# Patient Record
Sex: Male | Born: 1970 | Race: White | Hispanic: No | Marital: Single | Smoking: Never smoker
Health system: Southern US, Community
[De-identification: ages and names within clinical notes are randomized; demographics above are authoritative.]

---

## 2021-07-30 ENCOUNTER — Other Ambulatory Visit: Payer: Self-pay

## 2021-07-30 ENCOUNTER — Emergency Department (INDEPENDENT_AMBULATORY_CARE_PROVIDER_SITE_OTHER)
Admission: EM | Admit: 2021-07-30 | Discharge: 2021-07-30 | Disposition: A | Discharge status: Home / Self Care | Payer: Medicare Other | Source: Home / Self Care

## 2021-07-30 DIAGNOSIS — M79662 Pain in left lower leg: Secondary | ICD-10-CM

## 2021-07-30 DIAGNOSIS — M7989 Other specified soft tissue disorders: Secondary | ICD-10-CM

## 2021-07-30 NOTE — Discharge Instructions (Addendum)
Advised patient to return to clinic tomorrow Friday, 07/31/2021 at 10 AM for ultrasound study of left lower leg to rule out DVT.  Advised patient to go straight to imaging entrance downstairs behind building.
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
## 2021-07-31 ENCOUNTER — Other Ambulatory Visit: Payer: Medicare Other

## 2021-07-31 ENCOUNTER — Emergency Department
Admission: EM | Admit: 2021-07-31 | Discharge: 2021-07-31 | Disposition: A | Discharge status: Home / Self Care | Payer: Medicare Other | Source: Home / Self Care

## 2021-07-31 ENCOUNTER — Emergency Department (INDEPENDENT_AMBULATORY_CARE_PROVIDER_SITE_OTHER): Payer: Medicare Other

## 2021-07-31 ENCOUNTER — Telehealth: Payer: Self-pay | Admitting: Emergency Medicine

## 2021-07-31 DIAGNOSIS — M79606 Pain in leg, unspecified: Secondary | ICD-10-CM

## 2021-07-31 DIAGNOSIS — M79605 Pain in left leg: Secondary | ICD-10-CM

## 2021-07-31 DIAGNOSIS — M7989 Other specified soft tissue disorders: Secondary | ICD-10-CM | POA: Diagnosis not present

## 2021-07-31 NOTE — Progress Notes (Signed)
Call back from patient - results reviewed by Dr Cathren Harsh- ultrasound negative for DVT. Pt to continue to rest and elevate left lower leg. May alternate ice & heat to area. No other questions at this time

## 2021-07-31 NOTE — Telephone Encounter (Signed)
Call from [facility] regarding order for [place] of LLE to r/o DVT - order placed last night when pt was here being seen at [facility], order no longer available - chart reviewed w/ Dr Cathren Harsh - test reordered

## 2021-08-14 ENCOUNTER — Other Ambulatory Visit: Payer: Self-pay

## 2021-08-14 ENCOUNTER — Encounter: Payer: Self-pay | Admitting: Emergency Medicine

## 2021-08-14 ENCOUNTER — Emergency Department
Admission: EM | Admit: 2021-08-14 | Discharge: 2021-08-14 | Disposition: A | Discharge status: Home / Self Care | Payer: Medicare Other | Source: Home / Self Care | Attending: Family Medicine | Admitting: Family Medicine

## 2021-08-14 DIAGNOSIS — H6122 Impacted cerumen, left ear: Secondary | ICD-10-CM | POA: Diagnosis not present

## 2021-08-14 NOTE — ED Triage Notes (Signed)
Lt ear Cerumen Impaction. Saw PCP yesterday was referred to ENT, but came here.

## 2021-08-14 NOTE — Discharge Instructions (Addendum)
To prevent recurrent ear wax blockage, try the following: Soak two cotton balls with mineral oil, and gently place in each ear canal once weekly.  Leave the cotton balls in place for 10 to 20 minutes.  This will help liquefy the ear wax and aid your body's normal elimination process.  If applicable, do not use a hearing aid for 8 hours overnight.  Have your ears cleaned by a health professional every 6 to 12 months.  Avoid using "Q-tips" and ear wax softening solutions  

## 2021-08-14 NOTE — ED Provider Notes (Signed)
[facility]    CSN: [id number] Arrival date & time: 08/14/21  1019      History   Chief Complaint Chief Complaint  Patient presents with   Cerumen Impaction    HPI Matthew Delacruz is a 50 y.o. male.   Patient complains of left ear feeling full for about one week without pain.  He visited his PCP yesterday who advised ENT evaluation, but patient decided to come to urgent care.  The history is provided by the patient.   History reviewed. No pertinent past medical history.  There are no problems to display for this patient.   History reviewed. No pertinent surgical history.     Home Medications    Prior to Admission medications   Medication Sig Start Date End Date Taking? Authorizing Provider  amLODipine (NORVASC) 10 MG tablet Take 10 mg by mouth daily. 02/21/21   [provider]  apixaban (ELIQUIS) 5 MG TABS tablet Take by mouth. 10/30/20   [provider]  atorvastatin (LIPITOR) 80 MG tablet Take 1 tablet by mouth daily. 10/30/20   [provider]  HYDROcodone-acetaminophen (NORCO) 7.5-325 MG tablet Take by mouth. 06/30/21   [provider]  metoprolol succinate (TOPROL-XL) 100 MG 24 hr tablet Take by mouth. 10/22/20   [provider]  pantoprazole (PROTONIX) 40 MG tablet Take 1 tablet by mouth daily. 10/22/20   [provider]  venlafaxine XR (EFFEXOR-XR) 75 MG 24 hr capsule Take 1 capsule by mouth 2 (two) times daily. 10/29/20   [provider]    Family History Family History  Problem Relation Age of Onset   Hypertension Mother    Hypertension Father     Social History Social History   Tobacco Use   Smoking status: Never   Smokeless tobacco: Never  Vaping Use   Vaping Use: Never used  Substance Use Topics   Alcohol use: Not Currently     Allergies   Lorazepam and Lisinopril   Review of Systems Review of Systems  Constitutional:  Negative for chills and fever.  HENT:  Positive  for hearing loss. Negative for congestion, ear discharge, ear pain and facial swelling.   All other systems reviewed and are negative.   Physical Exam Triage Vital Signs ED Triage Vitals  Enc Vitals Group     BP 08/14/21 1039 (!) 145/95     Pulse Rate 08/14/21 1039 74     Resp 08/14/21 1039 18     Temp 08/14/21 1039 99 F (37.2 C)     Temp Source 08/14/21 1039 Oral     SpO2 08/14/21 1039 98 %     Weight 08/14/21 1040 240 lb (108.9 kg)     Height 08/14/21 1040 5\' 9"  (1.753 m)     Head Circumference --      Peak Flow --      Pain Score 08/14/21 1039 0     Pain Loc --      Pain Edu? --      Excl. in GC? --    No data found.  Updated Vital Signs BP (!) 145/95 (BP Location: Left Arm)   Pulse 74   Temp 99 F (37.2 C) (Oral)   Resp 18   Ht 5\' 9"  (1.753 m)   Wt 108.9 kg   SpO2 98%   BMI 35.44 kg/m   Visual Acuity Right Eye Distance:   Left Eye Distance:   Bilateral Distance:    Right Eye Near:   Left  Eye Near:    Bilateral Near:     Physical Exam Vitals and nursing note reviewed.  Constitutional:      General: He is not in acute distress. HENT:     Head: Normocephalic.     Comments: Post lavage, the left ear canal and tympanic membrane appear normal.    Right Ear: Tympanic membrane, ear canal and external ear normal.     Left Ear: External ear normal. There is impacted cerumen.     Nose: Nose normal.     Mouth/Throat:     Mouth: Mucous membranes are moist.  Eyes:     Conjunctiva/sclera: Conjunctivae normal.     Pupils: Pupils are equal, round, and reactive to light.  Cardiovascular:     Rate and Rhythm: Normal rate.  Pulmonary:     Effort: Pulmonary effort is normal.  Skin:    General: Skin is warm and dry.  Neurological:     Mental Status: He is alert and oriented to person, place, and time.     UC Treatments / Results  Labs (all labs ordered are listed, but only abnormal results are displayed) Labs Reviewed - No data to  display  EKG   Radiology No results found.  Procedures Procedures (including critical care time)  Medications Ordered in UC Medications - No data to display  Initial Impression / Assessment and Plan / UC Course  I have reviewed the triage vital signs and the nursing notes.  Pertinent labs & imaging results that were available during my care of the patient were reviewed by me and considered in my medical decision making (see chart for details).    No evidence otitis media or otitis externa. Followup with PCP as needed.  Final Clinical Impressions(s) / UC Diagnoses   Final diagnoses:  Impacted cerumen of left ear     Discharge Instructions      To prevent recurrent ear wax blockage, try the following: Soak two cotton balls with mineral oil, and gently place in each ear canal once weekly.  Leave the cotton balls in place for 10 to 20 minutes.  This will help liquefy the ear wax and aid your body's normal elimination process.  If applicable, do not use a hearing aid for 8 hours overnight.  Have your ears cleaned by a health professional every 6 to 12 months.  Avoid using "Q-tips" and ear wax softening solutions      ED Prescriptions   None       Lattie Haw, MD 08/15/21 2048
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
## 2025-01-28 ENCOUNTER — Other Ambulatory Visit (HOSPITAL_BASED_OUTPATIENT_CLINIC_OR_DEPARTMENT_OTHER): Payer: Self-pay

## 2025-01-28 ENCOUNTER — Other Ambulatory Visit (HOSPITAL_COMMUNITY): Payer: Self-pay
